# Patient Record
Sex: Male | Born: 1970 | Hispanic: Yes
Health system: Southern US, Community
[De-identification: ages and names within clinical notes are randomized; demographics above are authoritative.]

---

## 2014-03-14 ENCOUNTER — Emergency Department: Payer: Self-pay | Admitting: Emergency Medicine

## 2014-03-24 ENCOUNTER — Emergency Department: Payer: Self-pay | Admitting: Emergency Medicine

## 2015-04-10 ENCOUNTER — Emergency Department
Admit: 2015-04-10 | Disposition: A | Discharge status: Home / Self Care | Payer: Self-pay | Admitting: Emergency Medicine

## 2015-07-28 ENCOUNTER — Emergency Department
Admission: EM | Admit: 2015-07-28 | Discharge: 2015-07-28 | Disposition: A | Discharge status: Home / Self Care | Payer: Self-pay | Attending: Emergency Medicine | Admitting: Emergency Medicine

## 2015-07-28 DIAGNOSIS — Y288XXA Contact with other sharp object, undetermined intent, initial encounter: Secondary | ICD-10-CM | POA: Insufficient documentation

## 2015-07-28 DIAGNOSIS — IMO0002 Reserved for concepts with insufficient information to code with codable children: Secondary | ICD-10-CM

## 2015-07-28 DIAGNOSIS — S51812A Laceration without foreign body of left forearm, initial encounter: Secondary | ICD-10-CM | POA: Insufficient documentation

## 2015-07-28 DIAGNOSIS — Y9289 Other specified places as the place of occurrence of the external cause: Secondary | ICD-10-CM | POA: Insufficient documentation

## 2015-07-28 DIAGNOSIS — Y9389 Activity, other specified: Secondary | ICD-10-CM | POA: Insufficient documentation

## 2015-07-28 DIAGNOSIS — Y998 Other external cause status: Secondary | ICD-10-CM | POA: Insufficient documentation

## 2015-07-28 MED ORDER — TRIPLE ANTIBIOTIC 5-400-5000 EX OINT: TOPICAL_OINTMENT | Freq: Two times a day (BID) | CUTANEOUS | Status: AC

## 2015-07-28 MED ORDER — BACITRACIN ZINC 500 UNIT/GM EX OINT
TOPICAL_OINTMENT | CUTANEOUS | Status: AC
  Filled 2015-07-28: qty 0.9

## 2015-07-28 MED ORDER — BACITRACIN ZINC 500 UNIT/GM EX OINT
TOPICAL_OINTMENT | Freq: Two times a day (BID) | CUTANEOUS | Status: DC
  Administered 2015-07-28: 1 via TOPICAL

## 2015-07-28 NOTE — ED Notes (Addendum)
Laceration to left forearm while working with tile, states happened yesterday.

## 2015-07-28 NOTE — ED Provider Notes (Signed)
[facility] Emergency Department Provider Note  ____________________________________________  Time seen: Approximately 9:59 PM  I have reviewed the triage vital signs and the nursing notes. History physical and discharge obtained through via interpreter Spanish   HISTORY  Chief Complaint Laceration    HPI Matthew Kennedy is a 44 y.o. male who presents for evaluation of laceration to the left forearm yesterday while working with tile. Denies any active bleeding states tetanus is up-to-date.   No past medical history on file.  There are no active problems to display for this patient.   No past surgical history on file.  Current Outpatient Rx  Name  Route  Sig  Dispense  Refill  . neomycin-bacitracin-polymyxin (NEOSPORIN) [phone] ointment   Topical   Apply topically 2 (two) times daily.   28.3 g   0     Allergies Review of patient's allergies indicates no known allergies.  No family history on file.  Social History Social History  Substance Use Topics  . Smoking status: Not on file  . Smokeless tobacco: Not on file  . Alcohol Use: Not on file    Review of Systems Constitutional: No fever/chills Eyes: No visual changes. ENT: No sore throat. Cardiovascular: Denies chest pain. Respiratory: Denies shortness of breath. Gastrointestinal: No abdominal pain.  No nausea, no vomiting.  No diarrhea.  No constipation. Genitourinary: Negative for dysuria. Musculoskeletal: Negative for back pain. Skin: 2.5 cm laceration to left anterior forearm no active bleeding. Neurological: Negative for headaches, focal weakness or numbness.  10-point ROS otherwise negative.  ____________________________________________   PHYSICAL EXAM:  VITAL SIGNS: ED Triage Vitals  Enc Vitals Group     BP 07/28/15 2012 113/73 mmHg     Pulse Rate 07/28/15 2012 71     Resp 07/28/15 2012 18     Temp 07/28/15 2012 98.5 F (36.9 C)     Temp Source 07/28/15 2012 Oral     SpO2 07/28/15 2012 98 %     Weight 07/28/15 2012 190 lb (86.183 kg)     Height 07/28/15 2012  (1.676 m)     Head Cir --      Peak Flow --      Pain Score --      Pain Loc --      Pain Edu? --      Excl. in GC? --     Constitutional: Alert and oriented. Well appearing and in no acute distress. Cardiovascular: Normal rate, regular rhythm. Grossly normal heart sounds.  Good peripheral circulation. Respiratory: Normal respiratory effort.  No retractions. Lungs CTAB. Musculoskeletal: No lower extremity tenderness nor edema.  No joint effusions. Neurologic:  Normal speech and language. No gross focal neurologic deficits are appreciated. No gait instability. Skin:  Skin is warm, dry and intact. No rash noted. 2. 5 cm laceration superficial to the left anterior forearm. No active bleeding noted. Positive strength bilaterally with good distal neurovascularly intact Psychiatric: Mood and affect are normal. Speech and behavior are normal.  ____________________________________________   LABS (all labs ordered are listed, but only abnormal results are displayed)  Labs Reviewed - No data to display ____________________________________________   PROCEDURES  Procedure(s) performed: None  Critical Care performed: No  ____________________________________________   INITIAL IMPRESSION / ASSESSMENT AND PLAN / ED COURSE  Pertinent labs & imaging results that were available during my care of the patient were reviewed by me and considered in my medical decision making (see chart for details).  Superficial laceration left anterior  forearm. Bacitracin dressing applied with instructions to follow-up as needed. Motrin and Tylenol for pain.  Patient voices no other emergency medical complaints at this time. ____________________________________________   FINAL CLINICAL IMPRESSION(S) / ED DIAGNOSES  Final diagnoses:  Laceration      Evangeline Dakin, PA-C 07/28/15 2323  Sharman Cheek, MD 08/08/15 [phone]

## 2016-11-13 IMAGING — CR DG CHEST 2V
1 series · 2 of 2 positions shown · non-contrast
Comparison: None.

CLINICAL DATA: Acute onset of chest tightness and nonproductive
cough. Body aches, sore throat and fever. Initial encounter.

EXAM:
CHEST  2 VIEW

[Series 1: dxr chest pa (or ap) and lateral · 0.14mm/px · 2 of 2 slices shown]
[im 1/2]
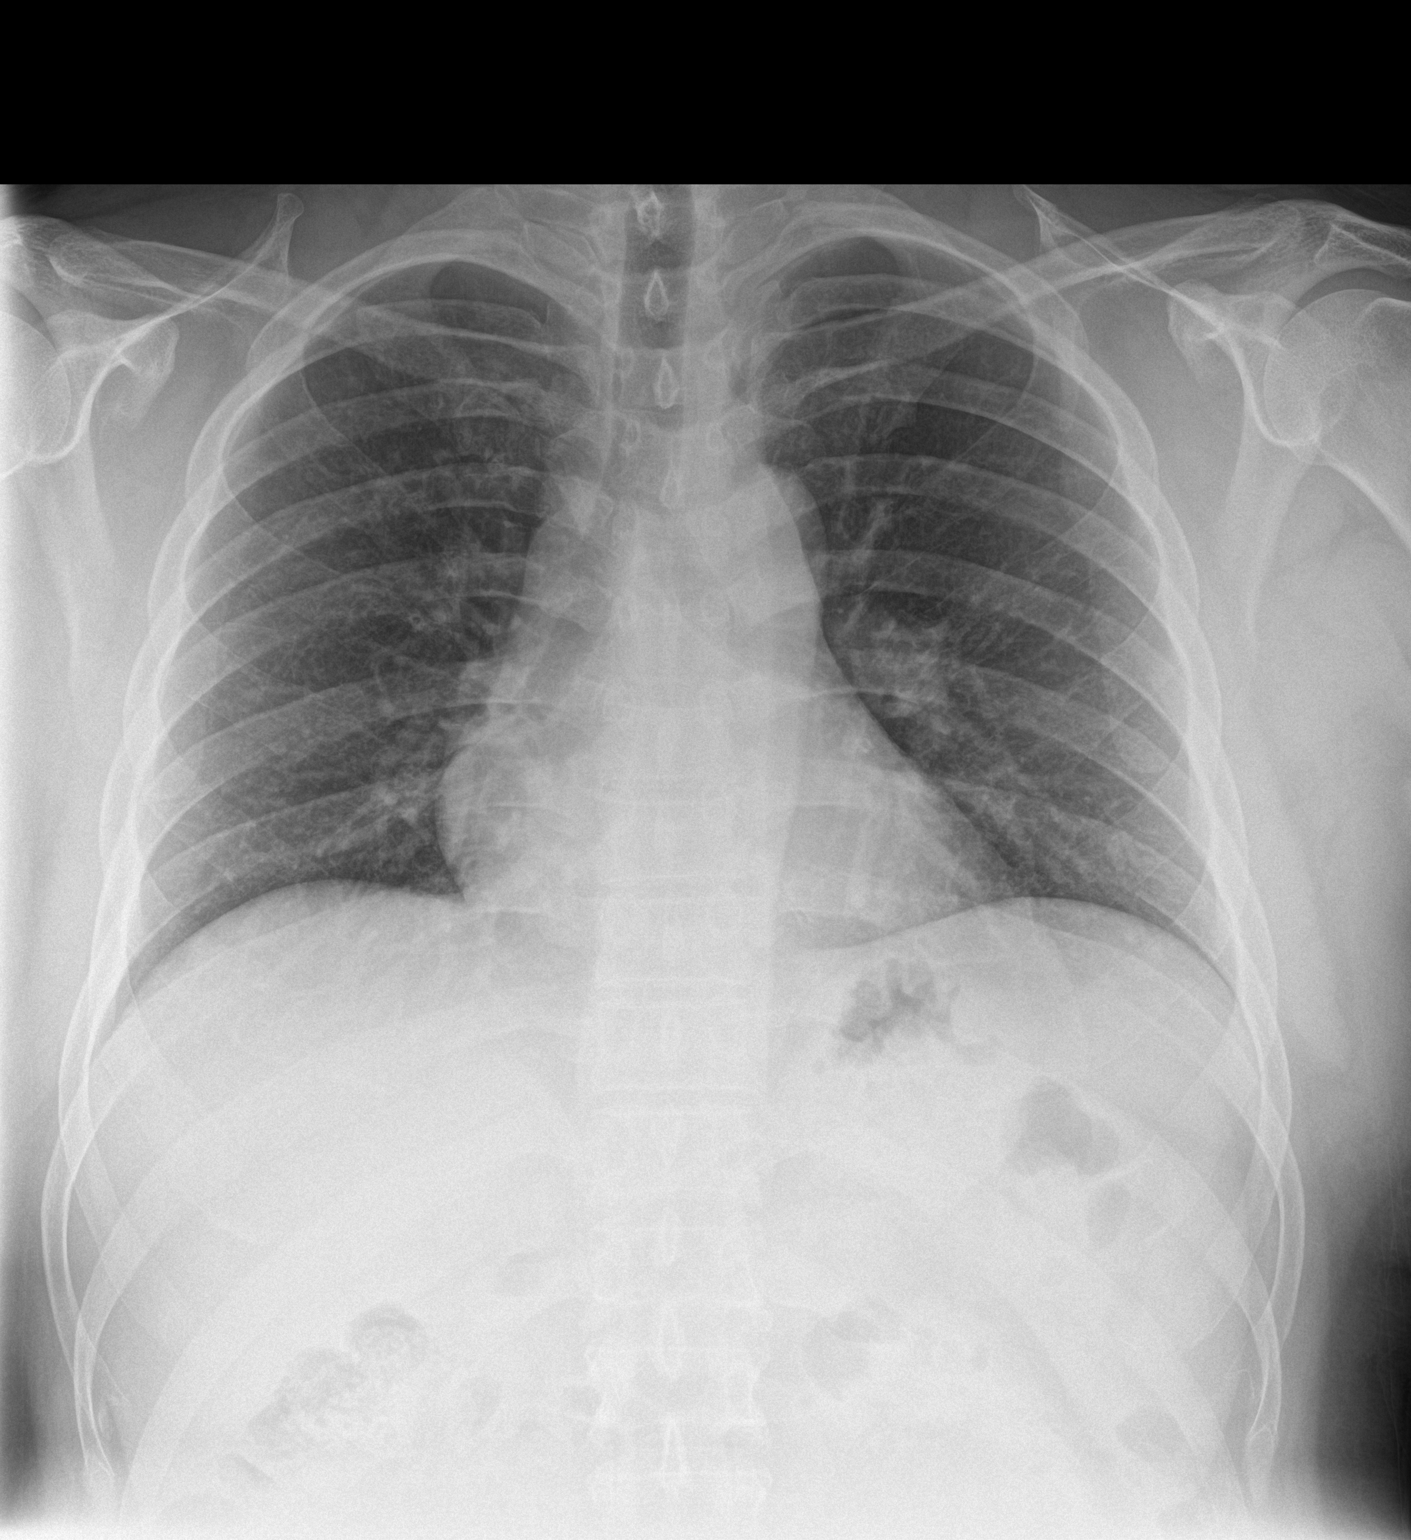
[im 2/2]
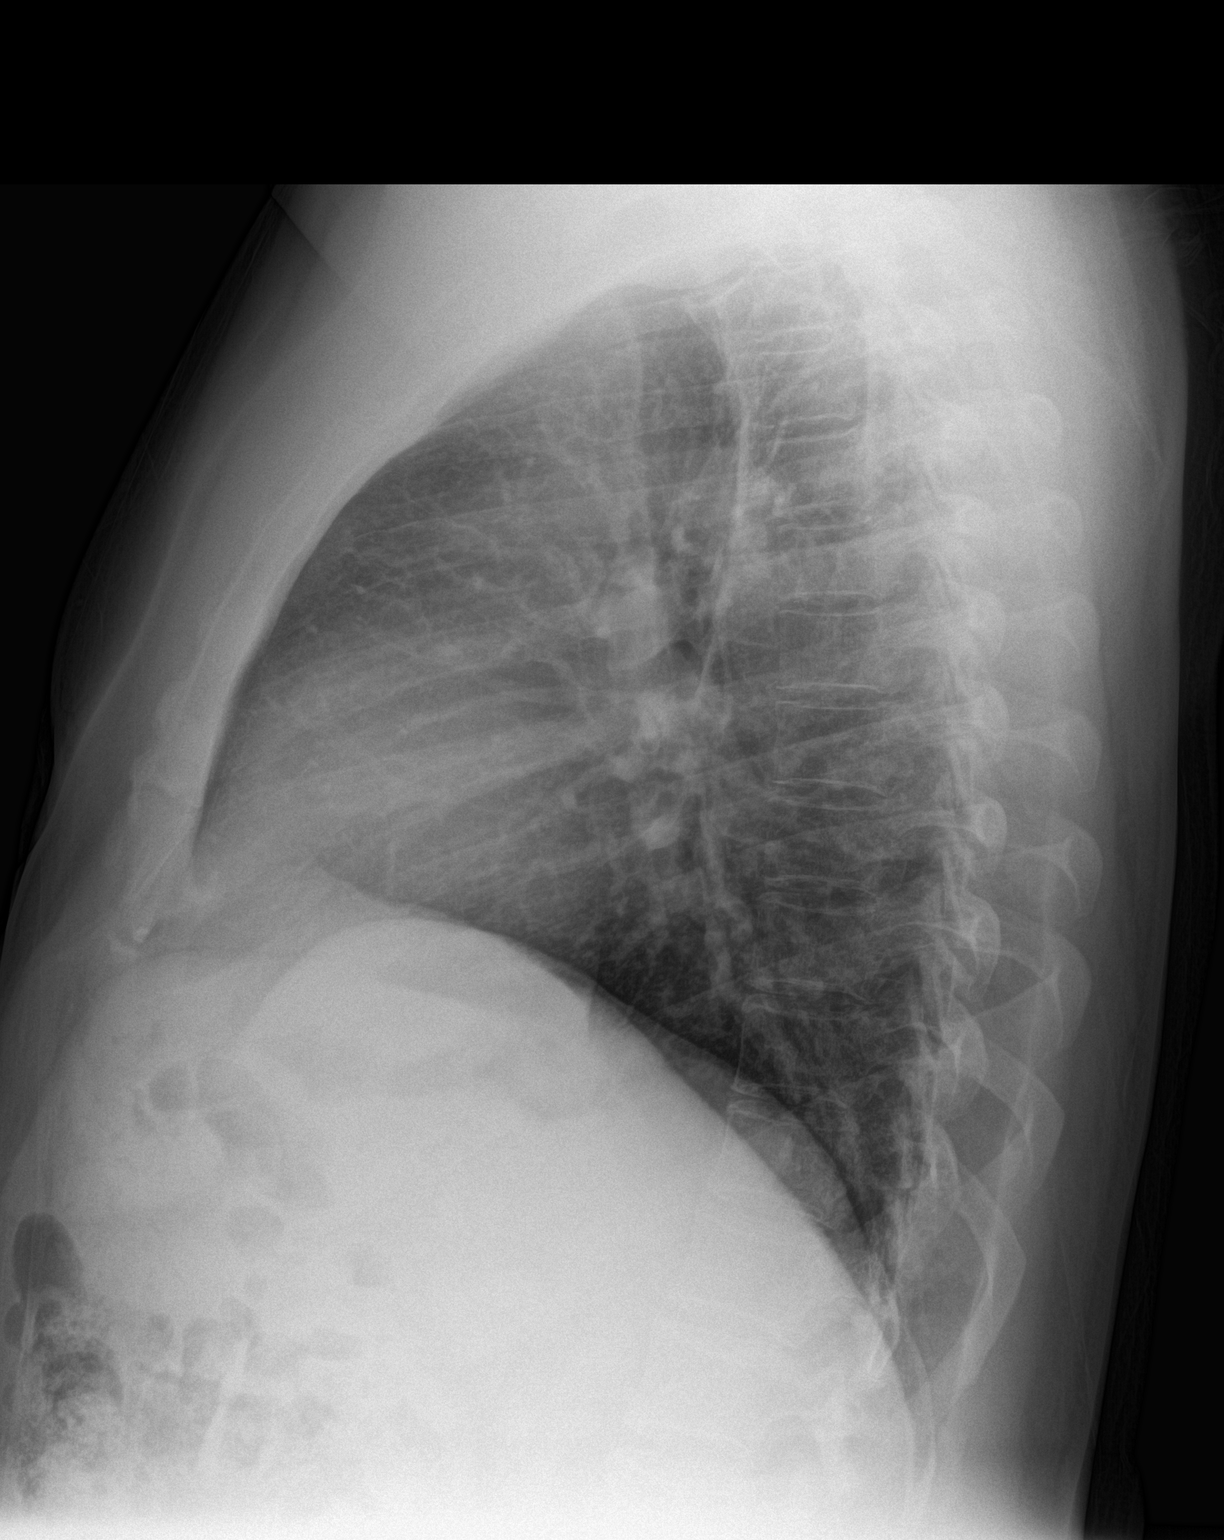

[2 of 2 positions shown; findings below may reference images not displayed]

FINDINGS: The lungs are well-aerated and clear. There is no evidence of focal
opacification, pleural effusion or pneumothorax.

The heart is normal in size; the mediastinal contour is within
normal limits. No acute osseous abnormalities are seen.
IMPRESSION: No acute cardiopulmonary process seen.
